# Patient Record
Sex: Male | Born: 1970
Health system: Southern US, Community
[De-identification: ages and names within clinical notes are randomized; demographics above are authoritative.]

## PROBLEM LIST (undated history)

## (undated) HISTORY — PX: HERNIA REPAIR: SHX51

---

## 2001-02-13 ENCOUNTER — Emergency Department (HOSPITAL_COMMUNITY): Admission: EM | Admit: 2001-02-13 | Discharge: 2001-02-13 | Payer: Self-pay | Admitting: Emergency Medicine

## 2001-02-13 ENCOUNTER — Encounter: Payer: Self-pay | Admitting: General Surgery

## 2001-02-21 ENCOUNTER — Encounter: Payer: Self-pay | Admitting: Urology

## 2001-02-21 ENCOUNTER — Ambulatory Visit (HOSPITAL_COMMUNITY): Admission: RE | Admit: 2001-02-21 | Discharge: 2001-02-21 | Payer: Self-pay | Admitting: Urology

## 2011-11-14 ENCOUNTER — Ambulatory Visit (INDEPENDENT_AMBULATORY_CARE_PROVIDER_SITE_OTHER): Payer: BC Managed Care – PPO | Admitting: Family Medicine

## 2011-11-14 VITALS — BP 107/71 | HR 74 | Temp 98.0°F | Resp 16 | Ht 65.0 in | Wt 165.0 lb

## 2011-11-14 DIAGNOSIS — Z0000 Encounter for general adult medical examination without abnormal findings: Secondary | ICD-10-CM

## 2011-11-14 DIAGNOSIS — R002 Palpitations: Secondary | ICD-10-CM

## 2011-11-14 DIAGNOSIS — H9192 Unspecified hearing loss, left ear: Secondary | ICD-10-CM

## 2011-11-14 LAB — POCT URINALYSIS DIPSTICK
Bilirubin, UA: NEGATIVE
Glucose, UA: NEGATIVE
Ketones, UA: NEGATIVE
Leukocytes, UA: NEGATIVE
Nitrite, UA: NEGATIVE
Protein, UA: NEGATIVE
Spec Grav, UA: 1.015
Urobilinogen, UA: 0.2
pH, UA: 5.5

## 2011-11-14 LAB — POCT UA - MICROSCOPIC ONLY
Bacteria, U Microscopic: NEGATIVE
Casts, Ur, LPF, POC: NEGATIVE
Crystals, Ur, HPF, POC: NEGATIVE
Epithelial cells, urine per micros: NEGATIVE
Mucus, UA: NEGATIVE
Yeast, UA: NEGATIVE

## 2011-11-14 LAB — POCT CBC
Granulocyte percent: 69.6 %G (ref 37–80)
HCT, POC: 45.6 % (ref 43.5–53.7)
Hemoglobin: 14.9 g/dL (ref 14.1–18.1)
Lymph, poc: 1.9 (ref 0.6–3.4)
MCH, POC: 30.9 pg (ref 27–31.2)
MCHC: 32.7 g/dL (ref 31.8–35.4)
MCV: 94.6 fL (ref 80–97)
MID (cbc): 0.5 (ref 0–0.9)
MPV: 7.4 fL (ref 0–99.8)
POC Granulocyte: 5.5 (ref 2–6.9)
POC LYMPH PERCENT: 23.9 %L (ref 10–50)
POC MID %: 6.5 %M (ref 0–12)
Platelet Count, POC: 280 10*3/uL (ref 142–424)
RBC: 4.82 M/uL (ref 4.69–6.13)
RDW, POC: 12.9 %
WBC: 7.9 10*3/uL (ref 4.6–10.2)

## 2011-11-14 NOTE — Progress Notes (Signed)
This is a 42 year old [place] man who is married with 2 children. He does nails. He exercises 5-6 days a week with home gym and jogging. He has no new complaints, but he is concerned about his hearing loss that occurred at age 70 and has never returned. He's had bilateral herniorrhaphies in the past but no symptoms were relatively to them.  Occasionally he notes palpitations(absence of heartbeat is how he describes it)  : Objective: Alert, cooperative, quite friendly and in no distress  HEENT: Unremarkable anatomically but he has almost complete hearing loss in the left side.  Neck: Supple no adenopathy no thyromegaly  Chest: Clear to auscultation  Heart: Regular no murmur or gallop  Abdomen: Soft nontender with smooth the border, liver span 7 cm midclavicular line, no splenomegaly or tenderness or masses  Genitalia: Herniorrhaphy scars bilaterally, testicles normal, uncircumcised  Skin: Warm and dry, no rash o  neurological: 4 extremities. Cranial nerves III through XII intact with the exception of loss of hearing on the left. This  Assessment: No new problems of significance at this time.  Plan usual annual physical labs.

## 2011-11-15 LAB — TSH: TSH: 1.449 u[IU]/mL (ref 0.350–4.500)

## 2011-11-15 LAB — COMPREHENSIVE METABOLIC PANEL
ALT: 19 U/L (ref 0–53)
AST: 22 U/L (ref 0–37)
Albumin: 4.5 g/dL (ref 3.5–5.2)
Alkaline Phosphatase: 71 U/L (ref 39–117)
BUN: 19 mg/dL (ref 6–23)
CO2: 26 mEq/L (ref 19–32)
Calcium: 9.3 mg/dL (ref 8.4–10.5)
Chloride: 105 mEq/L (ref 96–112)
Creat: 1.18 mg/dL (ref 0.50–1.35)
Glucose, Bld: 118 mg/dL — ABNORMAL HIGH (ref 70–99)
Potassium: 3.9 mEq/L (ref 3.5–5.3)
Sodium: 138 mEq/L (ref 135–145)
Total Bilirubin: 0.6 mg/dL (ref 0.3–1.2)
Total Protein: 7.4 g/dL (ref 6.0–8.3)

## 2011-11-15 LAB — LIPID PANEL
Cholesterol: 229 mg/dL — ABNORMAL HIGH (ref 0–200)
HDL: 66 mg/dL (ref >39)
LDL Cholesterol: 136 mg/dL — ABNORMAL HIGH (ref 0–99)
Total CHOL/HDL Ratio: 3.5 Ratio
Triglycerides: 135 mg/dL (ref <150)
VLDL: 27 mg/dL (ref 0–40)

## 2011-11-16 LAB — SEDIMENTATION RATE: Sed Rate: 1 mm/hr (ref 0–16)

## 2011-11-17 ENCOUNTER — Encounter: Payer: Self-pay | Admitting: *Deleted

## 2012-11-20 ENCOUNTER — Telehealth: Payer: Self-pay | Admitting: Internal Medicine

## 2012-11-20 DIAGNOSIS — H109 Unspecified conjunctivitis: Secondary | ICD-10-CM

## 2012-11-20 NOTE — Telephone Encounter (Signed)
Pt has conjunctivitis right eye. Wears glasses. Left eye not affected. No recent URI. Describes film over eye with irritation. Rx Ofloxacin opthalmic drops 2 gtts o.u.  4 times daily  x 5-7 days called to [employer].

## 2013-10-28 ENCOUNTER — Ambulatory Visit (INDEPENDENT_AMBULATORY_CARE_PROVIDER_SITE_OTHER): Payer: BC Managed Care – PPO | Admitting: Family Medicine

## 2013-10-28 VITALS — BP 122/68 | HR 84 | Temp 99.0°F | Resp 17 | Ht 65.5 in | Wt 164.0 lb

## 2013-10-28 DIAGNOSIS — Z23 Encounter for immunization: Secondary | ICD-10-CM

## 2013-10-28 DIAGNOSIS — Z789 Other specified health status: Secondary | ICD-10-CM

## 2013-10-28 DIAGNOSIS — Z1329 Encounter for screening for other suspected endocrine disorder: Secondary | ICD-10-CM

## 2013-10-28 DIAGNOSIS — Z130 Encounter for screening for diseases of the blood and blood-forming organs and certain disorders involving the immune mechanism: Secondary | ICD-10-CM

## 2013-10-28 DIAGNOSIS — Z13228 Encounter for screening for other metabolic disorders: Secondary | ICD-10-CM

## 2013-10-28 NOTE — Patient Instructions (Signed)
Vim Gan Siu Vi A (Hepatitis A) Vim gan siu vi A l b?nh nhi?m siu vi ? gan. Khng c b?ng ch?ng cho th?y b?nh vim gan siu vi A di?n ti?n thnh b?nh l m?n tnh (ko di). Vim gan A khng ti?n tri?n thnh ung th? gan gi?ng nh? vim gan B v vim gan C d?ng mn tnh th??ng b?. NGUYN NHN Vim gan siu vi A l gy ra b?i siu vi trng ???c g?i l siu vi vim gan A (g?i t?t l HAV). Siu vi trng xm nh?p vo c? th? qua ???ng mi?ng do th?c ?n hay n??c nhi?m khu?n (khng s?ch hay khng tinh khi?t). Sau khi xm nh?p vo trong c? th?, ban ??u nhi?m siu vi x?y ra ? ru?t, lan truy?n ??n gan v gy b?nh t?i ?. TRI?U CH?NG H?u h?t cc tr??ng h?p vim gan siu vi A nh? v c t tri?u ch?ng. Trong nh?ng tr??ng h?p nh? b?nh nhn c th? khng nh?n th?y h? ?ang m?c b?nh. Nh?ng ng??i khc c th? c?m th?y m?t m?i, bu?n nn v nn m?a, m?t c?m gic ngon mi?ng, b? s?t c?p th?p, ho?c b? ?au d??i x??ng s??n trn pha bn ph?i c?a b?ng. Sau ?, cc tri?u ch?ng c th? bao g?m n??c ti?u s?m mu, ?i tiu c mu sng, vng m?t v da (b?nh vng da) ho?c ng?a da. Vim gan A ?i khi c th? tr? nn nghim tr?ng v m?t s? tri?u ch?ng c th? ko di hng tu?n.  CH?N ?ON Chuyn gia ch?m [place] y t? c th? lm xt nghi?m mu ?? xem b?n c b? b?nh ny khng.  ?I?U TR? Vim gan A th??ng t? lnh sau vi tu?n. Khng c ph??ng php ?i?u tr? c? th? cho b?nh ny sau khi vi rt ? gy ra nhi?m trng. ?i?u quan tr?ng ??i v?i ng??i b? nhi?m b?nh l ngh? ng?i th?t nhi?u v khng tr? l?i lm vi?c hay ?i h?c l?i cho ??n khi h?t s?t, tr? l?i c?m gic ngon mi?ng, da v m?t khng cn mu vng. ?i?u c?ng quan tr?ng l trnh u?ng r??u v dng b?t c? lo?i thu?c no khng ???c ch? ??nh ho?c ph duy?t b?i chuyn gia ch?m [place] y t? c?a b?n.  PHNG NG?A S? d?ng globulin mi?n d?ch ng??i sau khi ti?p xc s? gip ng?n ng?a ho?c gi?m b?t m?c ?? nghim tr?ng c?a b?nh. Thu?c ny ch? c hi?u qu? n?u ???c s? d?ng trong vng 2 tu?n sau khi ti?p xc.  V?cxin  vim gan A c s?n v c hi?u qu? cao trong vi?c ng?n ng?a b?nh vim gan A khi ???c s? d?ng c? tr??c v sau khi ti?p xc v?i b?nh ly nhi?m ny. T?i [place] K?, v?cxin ny ???c khuy?n co nh? l s? thay th? cho globulin mi?n d?ch ng??i trong vng 2 tu?n sau khi ti?p xc v?i vi rt c?ng nh? cho nh?ng ng??i sau ?y tr??c khi ti?p xc:   T?t c? tr? em b?t ??u t? 1 tu?i.  Tr? em ? ?? tu?i t? 2 ??n 18 ? nh?ng ti?u bang v c?ng ??ng c cc ch??ng trnh tim ch?ng hi?n c.  Du khch qu?c t? ??n t?t c? cc vng m b?nh vim gan A v?n cn l m?t v?n ??.  Nh?ng ng??i ?n ng c quan h? tnh d?c v?i nam gi?i.  Nh?ng ng??i tim chch ma ty.  Nh?ng ng??i b? b?nh gan mn tnh do b?t k? nguyn nhn no.  Nh?ng ng??i s? d?ng y?u t? ?ng mu c ??c.  Nh?ng ng??i lm vi?c v?i vi rt vim gan A trong mi tr??ng phng th nghi?m nghin c?u. H??NG D?N CH?M Alpha T?I NH Sau khi b? nhi?m HAV, s? c th?i k? ? b?nh t? hai ??n su tu?n l? tr??c khi kh?i pht b?nh. Sau th?i gian ny siu vi vim gan A s? b? th?i qua phn trong vng 10 ??n 14 ngy. V cc ph?n t? siu vi th?i qua phn, gi? v? sinh t?t s? gip ng?n ng?a ly lan b?nh.   Nn th??ng xuyn r?a tay sau khi ?i v? sinh v tr??c khi l?y th?c ?n hay n??c u?ng.  ?y l b?nh ly nhi?m. Tun theo cc h??ng d?n c?a Bc s? ?? trnh ly lan b?nh.  Nh?ng ng??i s?ng v?i b?n c?n ???c tim v?cxin vim gan A.  Khng s? d?ng b?t k? lo?i thu?c no, bao g?m c? thu?c khng c?n k ??n ph? bi?n, cho ??n khi b?n h?i chuyn gia ch?m [place] y t? xem s? d?ng thu?c ny c ???c khng. HY ??N KHM B?NH NGAY L?P T?C N?U:  Khng th? ?n hay u?ng.  Bu?n nn ho?c i m?a b?t ??u ho?c ti?p t?c.  L l?n.  Vng da vng m?t ngy cng n?ng h?n.  L bu?n ng? nhi?u hay khng th? th?c t?nh. HY CH?C CH?N R?NG B?N:  Hi?u r nh?ng h??ng d?n khi xu?t vi?n.  S? theo di tnh tr?ng b?nh c?a b?n.  S? ??n khm b?nh ngay l?p t?c nh? ? ???c h??ng d?n. Document Released: 07/31/2005 Document  Revised: 10/23/2011 [facility] Patient Information 2014 [place], [place]. Immunization Information for Foreign Travel Immunizations can protect you from certain diseases. Immunizations can also prevent the spread of certain infections. It is important to see your caregiver or a travel medicine specialist 4 6 weeks before you travel. This allows time for vaccines to take effect. It also provides enough time for you to get vaccines that must be given in a series over a period of days or weeks. Immunizations for travelers include:  Routine vaccines. These vaccines are standard for the people in a country.  Recommended vaccines. These vaccines are recommended before travel to some countries or regions.  Required vaccines. These vaccines are necessary before travel to specific countries or regions. If it is less than 4 weeks before you leave, you should still see your caregiver. You might still benefit from vaccines or medicines. WHAT ARE THE ROUTINE VACCINES? Routine vaccines can protect you from diseases that are common in many parts of the world. Most routine vaccines are given at specific ages during your life. However, routine vaccines also include the annual flu (influenza) vaccine. You should be up-to-date on your routine immunizations before you travel. Your caregiver will be able to review your vaccine history and determine whether you have had all the routine vaccines. You may be advised to get extra doses or booster vaccines even if you are up-to-date on the routine vaccines. WHAT ARE THE RECOMMENDED VACCINES? Know your travel schedule when you visit your caregiver. The vaccines recommended before foreign travel will depend on several factors, including:  The country or countries of travel.  Whether you will travel to rural areas.  The length of time you will be traveling.  The season of the year.  Your age.  Your health status.  Your previous immunizations. Vaccine recommendations  change over time. Your caregiver can tell you what vaccines are recommended before  your trip. The annual influenza vaccine sometimes differs for the [place] and [place] hemispheres. Unless the annual vaccines are the same in both hemispheres, people with certain chronic medical conditions who are traveling to the other hemisphere shortly before or during the influenza season should also get the other influenza vaccine. The other influenza vaccine should be obtained either before leaving the country or shortly after arrival at the travel site. WHAT ARE THE REQUIRED VACCINES? Vaccines may be required during a current outbreak of an infectious disease in a country or region. Your caregiver will be able to tell you about any current outbreaks and required vaccines. For example, proof of yellow fever immunization is currently required for most people before traveling to certain countries in [place] and [place]. This vaccine can only be obtained at approved centers. You should get the yellow fever vaccine at least 10 days before your trip. After 10 days, most people show immunity to yellow fever. If it has been longer than 10 years since you received the yellow fever vaccine, another dose is required. If proof of immunization is incomplete or inaccurate, you could be quarantined, denied entry, or given another dose of vaccine at the travel site. If you cannot receive the yellow fever vaccine because of medical reasons, you must have a written statement from your caregiver. The statement must contain a medical reason for the lack of immunization. In such a case, your caregiver should then give you advice on how to decrease your chance of getting yellow fever. That advice should include taking precautions to avoid mosquito bites and limiting outdoor time. Other than having a medical condition or being under the age of 52 months, no other reasons will be accepted for not getting the vaccine.  Proof of  meningococcal immunization is required by the [place] for any person older than 2 years who is taking part in the [place] or [place]. Visas for traveling to the hajj or [place] will not even be issued until there is proof of immunization. You should get this vaccine at least 10 days before your trip. After 10 days, most people show immunity. If it has been longer than 3 years since your last immunization, another dose is required. FOR MORE INFORMATION  Centers for Disease Control and Prevention (CDC): http://www.wolf.info/  World Health Organization [facility]): RoleLink.com.br Document Released: 07/19/2009 Document Revised: 07/17/2012 Document Reviewed: 06/28/2012 [facility] Patient Information 2014 [place], [place].

## 2013-10-28 NOTE — Progress Notes (Signed)
° °  Subjective:    Patient ID: Craig King, male    DOB: 1971-02-20, 43 y.o.   MRN: [id number] This chart was scribed for Robyn Haber, MD by Anastasia Pall, ED Scribe. This patient was seen in room 10 and the patient's care was started at 6:55 PM.  Chief Complaint  Patient presents with   Immunizations    hep c, TDAP   HPI Craig King is a 43 y.o. male Pt presents to the [facility] for Hep-A and TDAP immunizations. He denies h/o the same immunizations. 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 He reports he is leaving the country to go to [place] in a few weeks. He denies any other requests, any symptoms.   PCP - No primary provider on file.  Patient Active Problem List   Diagnosis Date Noted   Hearing loss in left ear 11/14/2011   Prior to Admission medications   Not on File   Review of Systems  Constitutional: Negative for fever and activity change.   BP 122/68   Pulse 84   Temp(Src) 99 F (37.2 C) (Oral)   Resp 17   Ht 5' 5.5" (1.664 m)   Wt 164 lb (74.39 kg)   BMI 26.87 kg/m2   SpO2 98%     Objective:   Physical Exam Nursing note and vitals reviewed. Constitutional: He is oriented to person, place, and time. He appears well-developed and well-nourished. No distress.  HENT:  Head: Normocephalic and atraumatic.  Eyes: EOM are normal.  Neck: Neck supple.  Cardiovascular: Normal rate.   Pulmonary/Chest: Effort normal. No respiratory distress.  Musculoskeletal: Normal range of motion.  Neurological: He is alert and oriented to person, place, and time.  Skin: Skin is warm and dry.  Psychiatric: He has a normal mood and affect. His behavior is normal.      Assessment & Plan:  Patient travels - Plan: Hepatitis A vaccine adult IM, Tdap vaccine greater than or equal to 7yo IM  Immunization due - Plan: Hepatitis A vaccine adult IM, Tdap vaccine greater than or equal to 7yo IM  Signed, Robyn Haber, MD

## 2014-04-29 ENCOUNTER — Ambulatory Visit (INDEPENDENT_AMBULATORY_CARE_PROVIDER_SITE_OTHER): Payer: BC Managed Care – PPO

## 2014-04-29 VITALS — HR 77 | Wt 165.0 lb

## 2014-04-29 DIAGNOSIS — Z23 Encounter for immunization: Secondary | ICD-10-CM

## 2014-04-29 DIAGNOSIS — Z2839 Other underimmunization status: Secondary | ICD-10-CM

## 2014-04-29 DIAGNOSIS — Z283 Underimmunization status: Secondary | ICD-10-CM

## 2014-04-29 NOTE — Progress Notes (Signed)
   Subjective:    Patient ID: Craig King, male    DOB: June 12, 1971, 43 y.o.   MRN: [id number]  HPI Patient is here for his 2nd Hep A shot.     Review of Systems     Objective:   Physical Exam        Assessment & Plan:

## 2017-05-28 ENCOUNTER — Ambulatory Visit (INDEPENDENT_AMBULATORY_CARE_PROVIDER_SITE_OTHER): Payer: BLUE CROSS/BLUE SHIELD | Admitting: Family Medicine

## 2017-05-28 ENCOUNTER — Encounter: Payer: Self-pay | Admitting: Family Medicine

## 2017-05-28 VITALS — BP 112/74 | HR 95 | Temp 98.1°F | Resp 18 | Ht 65.55 in | Wt 165.2 lb

## 2017-05-28 DIAGNOSIS — G8929 Other chronic pain: Secondary | ICD-10-CM

## 2017-05-28 DIAGNOSIS — M25512 Pain in left shoulder: Secondary | ICD-10-CM | POA: Diagnosis not present

## 2017-05-28 NOTE — Patient Instructions (Signed)
     IF you received an x-ray today, you will receive an invoice from [facility]. Please contact  Radiology at [phone] with questions or concerns regarding your invoice.   IF you received labwork today, you will receive an invoice from [facility]. Please contact [facility] at [phone] with questions or concerns regarding your invoice.   Our billing staff will not be able to assist you with questions regarding bills from these companies.  You will be contacted with the lab results as soon as they are available. The fastest way to get your results is to activate your My Chart account. Instructions are located on the last page of this paperwork. If you have not heard from us regarding the results in 2 weeks, please contact this office.     

## 2017-05-28 NOTE — Progress Notes (Signed)
   10/15/201811:31 AM  Matilde Haymaker [birth year], 46 y.o. male [id number]  Chief Complaint  Patient presents with  . Shoulder Pain    both are sore but left is worse and goes all the way to elbow x2weeks     HPI:   Patient is a 46 y.o. male who presents today for worsening left shoulder pain, intermittent, for over a year, denies any trauma or surgeries, pain mostly when trying to reach arm overhead such as when trying to take off his shirt. Pain sometime radiates down his deltoid, no numbness/tingling or weakness. Thought it might be related to tennis elbow, though his elbow does not hurt, but those types of exercises have not helped.   Depression screen PHQ 2/9 05/28/2017  Decreased Interest 0  Down, Depressed, Hopeless 0  PHQ - 2 Score 0    No Known Allergies  Prior to Admission medications   Medication Sig Start Date End Date Taking? Authorizing Provider  Omega-3 Fatty Acids (FISH OIL) 1000 MG CAPS Take by mouth.   Yes [provider]    History reviewed. No pertinent past medical history.  Past Surgical History:  Procedure Laterality Date  . HERNIA REPAIR      Social History  Substance Use Topics  . Smoking status: Former Smoker    Packs/day: 0.50    Years: 10.00    Types: Cigarettes  . Smokeless tobacco: Never Used  . Alcohol use No    Family History  Problem Relation Age of Onset  . Hyperlipidemia Mother     ROS Per hpi  OBJECTIVE:  Blood pressure 112/74, pulse 95, temperature 98.1 F (36.7 C), temperature source Oral, resp. rate 18, height 5' 5.55" (1.665 m), weight 165 lb 3.2 oz (74.9 kg), SpO2 97 %.  Physical Exam Gen: AAOx3, NAD MSK: Right shoulder unremarkable. LEFT shoulder no swelling, erythema or TTP. FROM, mild pain with drop arch and lift, negative hawkins, positive neers. Elbow with mild TTP over medial epicondyle, otherwise unremarkable Strength, sensation and DTRs normal.    ASSESSMENT and PLAN  1. Chronic left shoulder pain Exam  suggestive of rotator cuff pathology. Discussed conservative measures. Provided patient educational handout with exercises. RTC precautions given.   Return if symptoms worsen or fail to improve.    Rutherford Guys, MD Primary Care at [place], [place] [address] Ph.  [phone] Fax [phone]

## 2017-07-31 ENCOUNTER — Ambulatory Visit (INDEPENDENT_AMBULATORY_CARE_PROVIDER_SITE_OTHER): Payer: BLUE CROSS/BLUE SHIELD

## 2017-07-31 ENCOUNTER — Encounter: Payer: Self-pay | Admitting: Family Medicine

## 2017-07-31 ENCOUNTER — Ambulatory Visit: Payer: BLUE CROSS/BLUE SHIELD | Admitting: Family Medicine

## 2017-07-31 ENCOUNTER — Other Ambulatory Visit: Payer: Self-pay

## 2017-07-31 VITALS — BP 118/64 | HR 92 | Temp 98.6°F | Resp 16 | Ht 65.55 in | Wt 166.6 lb

## 2017-07-31 DIAGNOSIS — M25512 Pain in left shoulder: Secondary | ICD-10-CM

## 2017-07-31 DIAGNOSIS — M19012 Primary osteoarthritis, left shoulder: Secondary | ICD-10-CM | POA: Diagnosis not present

## 2017-07-31 DIAGNOSIS — G8929 Other chronic pain: Secondary | ICD-10-CM

## 2017-07-31 NOTE — Progress Notes (Signed)
   12/18/20182:13 PM  M[place]on 12/05/1970, 46 y.o. male [id number]  Chief Complaint  Patient presents with  . Follow-up    left shoulder pain not any better     HPI:   Patient is a 46 y.o. male who presents today for worsening left shoulder pain, intermittent, for over a year, denies any trauma or surgeries, pain mostly when trying to reach arm overhead such as when trying to take off his shirt. Pain sometime radiates down his deltoid, no numbness/tingling or weakness. Thought it might be related to tennis elbow, though his elbow does not hurt, but those types of exercises have not helped. Last seen in Oct 2018, given exercises for rotator cuff strengthening, he has been doing these without any benefit. Now also hurts to sleep on it.   Depression screen PHQ 2/9 05/28/2017  Decreased Interest 0  Down, Depressed, Hopeless 0  PHQ - 2 Score 0    No Known Allergies  Prior to Admission medications   Medication Sig Start Date End Date Taking? Authorizing Provider  Omega-3 Fatty Acids (FISH OIL) 1000 MG CAPS Take by mouth.   Yes [provider]    History reviewed. No pertinent past medical history.  Past Surgical History:  Procedure Laterality Date  . HERNIA REPAIR      Social History   Tobacco Use  . Smoking status: Former Smoker    Packs/day: 0.50    Years: 10.00    Pack years: 5.00    Types: Cigarettes  . Smokeless tobacco: Never Used  Substance Use Topics  . Alcohol use: No    Family History  Problem Relation Age of Onset  . Hyperlipidemia Mother     ROS Per hpi  OBJECTIVE:  Blood pressure 118/64, pulse 92, temperature 98.6 F (37 C), resp. rate 16, height 5' 5.55" (1.665 m), weight 166 lb 9.6 oz (75.6 kg), SpO2 98 %.  Physical Exam  Gen: AAOx3, NAD MSK: Right shoulder unremarkable. LEFT shoulder no swelling, erythema, mild TTP over AC joint. FROM, mild pain with drop arch and lift, negative hawkins and neers, positive empty can. Strength, sensation  and DTRs normal.   Dg Shoulder Left  Result Date: 07/31/2017 CLINICAL DATA:  Left shoulder pain.  No injury. EXAM: LEFT SHOULDER - 2+ VIEW COMPARISON:  No recent . FINDINGS: No acute bony or joint abnormality otherwise noted. No evidence of fracture or dislocation. Minimal acromioclavicular degenerative change present. IMPRESSION: . No acute bony abnormality otherwise noted. Minimal acromioclavicular degenerative change present. Electronically Signed   By: Marcello Moores  Register   On: 07/31/2017 14:41     ASSESSMENT and PLAN 1. Chronic left shoulder pain - DG Shoulder Left; Future - Ambulatory referral to Orthopedic Surgery  Return if symptoms worsen or fail to improve.    Rutherford Guys, MD Primary Care at [place], [place] [address] Ph.  [phone] Fax [phone]

## 2017-07-31 NOTE — Patient Instructions (Signed)
     IF you received an x-ray today, you will receive an invoice from [facility]. Please contact [facility] at [phone] with questions or concerns regarding your invoice.   IF you received labwork today, you will receive an invoice from [facility]. Please contact [facility] at [phone] with questions or concerns regarding your invoice.   Our billing staff will not be able to assist you with questions regarding bills from these companies.  You will be contacted with the lab results as soon as they are available. The fastest way to get your results is to activate your My Chart account. Instructions are located on the last page of this paperwork. If you have not heard from us regarding the results in 2 weeks, please contact this office.     

## 2017-08-28 ENCOUNTER — Ambulatory Visit (INDEPENDENT_AMBULATORY_CARE_PROVIDER_SITE_OTHER): Payer: BLUE CROSS/BLUE SHIELD | Admitting: Orthopaedic Surgery

## 2017-08-28 ENCOUNTER — Encounter (INDEPENDENT_AMBULATORY_CARE_PROVIDER_SITE_OTHER): Payer: Self-pay | Admitting: Orthopaedic Surgery

## 2017-08-28 VITALS — BP 108/75 | HR 81 | Ht 66.0 in | Wt 165.0 lb

## 2017-08-28 DIAGNOSIS — M7712 Lateral epicondylitis, left elbow: Secondary | ICD-10-CM | POA: Diagnosis not present

## 2017-08-28 NOTE — Progress Notes (Signed)
Office Visit Note   Patient: Craig King           Date of Birth: 1970/11/11           MRN: [id number] Visit Date: 08/28/2017              Requested by: Rutherford Guys, MD [address] [place], [place] [address] PCP: Patient, No Pcp Per   Assessment & Plan: Visit Diagnoses:  1. Lateral epicondylitis, left elbow     Plan: Tennis elbow splint applied he can use intermittently.  Pathophysiology of lateral epicondylitis discussed.  If he has persistent symptoms he can return we can consider injection.  Activity modification discussed and reviewed.  Follow-Up Instructions: Return if symptoms worsen or fail to improve.   Orders:  No orders of the defined types were placed in this encounter.  No orders of the defined types were placed in this encounter.     Procedures: No procedures performed   Clinical Data: No additional findings.   Subjective: Chief Complaint  Patient presents with  . Left Shoulder - Pain    HPI   47 year old male who has had intermittent symptoms in his left elbow.  He is left-hand dominant had to quit playing tennis due to it.  He switched to bowling had some increased pain.  He worked out recently stopped using weights for period of time and just doing cardio due to increased left elbow discomfort.  Working he does nails.  He has had some pain that tends to radiate up to his shoulder he denies any numbness or tingling in his fingers no associated neck pain.  He denies fever or chills.  Review of Systems 14 point review of systems negative only surgery is been previous hernia surgery 1998.  He is not on any medications no allergies non-smoker does not drink.   Objective: Vital Signs: BP 108/75   Pulse 81   Ht 5' 6"  (1.676 m)   Wt 165 lb (74.8 kg)   BMI 26.63 kg/m   Physical Exam  Constitutional: He is oriented to person, place, and time. He appears well-developed and well-nourished.  HENT:  Head: Normocephalic and atraumatic.  Eyes: EOM are  normal. Pupils are equal, round, and reactive to light.  Neck: No tracheal deviation present. No thyromegaly present.  Cardiovascular: Normal rate.  Pulmonary/Chest: Effort normal. He has no wheezes.  Abdominal: Soft. Bowel sounds are normal.  Neurological: He is alert and oriented to person, place, and time.  Skin: Skin is warm and dry. Capillary refill takes less than 2 seconds.  Psychiatric: He has a normal mood and affect. His behavior is normal. Judgment and thought content normal.    Ortho Exam patient get his arm over his head easily good cervical range of motion rapidly no brachial plexus tenderness minimal discomfort with impingement test left shoulder.  Good muscle development upper extremities from working out.  Lateral epicondyle pressure reproduces his pain slight pain with full extension of the elbow at the lateral epicondyle radial head is normal collateral ligaments are normal wrist extension flexion normal some pain at the lateral epicondyle with gripping.  Specialty Comments:  No specialty comments available.  Imaging: No results found.   PMFS History: Patient Active Problem List   Diagnosis Date Noted  . Hearing loss in left ear 11/14/2011   No past medical history on file.  Family History  Problem Relation Age of Onset  . Hyperlipidemia Mother     Past Surgical History:  Procedure Laterality Date  . HERNIA REPAIR     Social History   Occupational History  . Not on file  Tobacco Use  . Smoking status: Former Smoker    Packs/day: 0.50    Years: 10.00    Pack years: 5.00    Types: Cigarettes  . Smokeless tobacco: Never Used  Substance and Sexual Activity  . Alcohol use: No  . Drug use: No  . Sexual activity: Yes    Birth control/protection: Pill

## 2018-08-12 ENCOUNTER — Ambulatory Visit (INDEPENDENT_AMBULATORY_CARE_PROVIDER_SITE_OTHER): Payer: Self-pay

## 2018-08-12 ENCOUNTER — Ambulatory Visit (INDEPENDENT_AMBULATORY_CARE_PROVIDER_SITE_OTHER): Payer: BLUE CROSS/BLUE SHIELD | Admitting: Orthopaedic Surgery

## 2018-08-12 ENCOUNTER — Encounter (INDEPENDENT_AMBULATORY_CARE_PROVIDER_SITE_OTHER): Payer: Self-pay | Admitting: Orthopaedic Surgery

## 2018-08-12 VITALS — BP 123/76 | HR 94 | Ht 66.0 in | Wt 165.0 lb

## 2018-08-12 DIAGNOSIS — M25511 Pain in right shoulder: Secondary | ICD-10-CM | POA: Diagnosis not present

## 2018-08-12 DIAGNOSIS — M7521 Bicipital tendinitis, right shoulder: Secondary | ICD-10-CM

## 2018-08-12 MED ORDER — METHYLPREDNISOLONE ACETATE 40 MG/ML IJ SUSP
40.0000 mg | INTRAMUSCULAR | Status: AC | PRN
  Administered 2018-08-12: 40 mg via INTRA_ARTICULAR

## 2018-08-12 MED ORDER — BUPIVACAINE HCL 0.25 % IJ SOLN
0.6600 mL | INTRAMUSCULAR | Status: AC | PRN
  Administered 2018-08-12: .66 mL via INTRA_ARTICULAR

## 2018-08-12 MED ORDER — LIDOCAINE HCL 1 % IJ SOLN
0.5000 mL | INTRAMUSCULAR | Status: AC | PRN
  Administered 2018-08-12: .5 mL

## 2018-08-12 NOTE — Progress Notes (Signed)
Office Visit Note   Patient: Craig King           Date of Birth: Dec 29, 1970           MRN: [id number] Visit Date: 08/12/2018              Requested by: No referring provider defined for this encounter. PCP: Patient, No Pcp Per   Assessment & Plan: Visit Diagnoses:  1. Acute pain of right shoulder   2. Biceps tendinitis of right shoulder     Plan: Right peri-long of the biceps tenderness injection performed with good relief.  We discussed activity modification with his workout exercise regiment.  He will return if he has increased symptoms.  He does not get relief we can consider diagnostic imaging evaluating for pot biceps tendinopathy of the long head.  Follow-Up Instructions: No follow-ups on file.   Orders:  Orders Placed This Encounter  Procedures  . Large Joint Inj  . XR Shoulder Right   No orders of the defined types were placed in this encounter.     Procedures: Large Joint Inj: R glenohumeral on 08/12/2018 2:52 PM Indications: pain Details: 22 G 1.5 in needle  Arthrogram: No  Medications: 40 mg methylPREDNISolone acetate 40 MG/ML; 0.5 mL lidocaine 1 %; 0.66 mL bupivacaine 0.25 % Outcome: tolerated well, no immediate complications Procedure, treatment alternatives, risks and benefits explained, specific risks discussed. Consent was given by the patient. Immediately prior to procedure a time out was called to verify the correct patient, procedure, equipment, support staff and site/side marked as required. Patient was prepped and draped in the usual sterile fashion.       Clinical Data: No additional findings.   Subjective: Chief Complaint  Patient presents with  . Right Shoulder - Pain    HPI 47 year old male [place] with right shoulder pain anteriorly for 3 to 4 weeks.  Is not worked on 4 to 5 weeks he does not use free weights anymore.  He notices increased pain particularly with pulling he does better sometimes of his arms up over his head.  He is  tried some Advil without relief also use some heat.  Review of Systems patient has been young healthy has hearing loss in his left ear otherwise negative.  Negative cardiovascular GI GU.   Objective: Vital Signs: BP 123/76   Pulse 94   Ht 5' 6"  (1.676 m)   Wt 165 lb (74.8 kg)   BMI 26.63 kg/m   Physical Exam Constitutional:      Appearance: He is well-developed.  HENT:     Head: Normocephalic and atraumatic.  Eyes:     Pupils: Pupils are equal, round, and reactive to light.  Neck:     Thyroid: No thyromegaly.     Trachea: No tracheal deviation.  Cardiovascular:     Rate and Rhythm: Normal rate.  Pulmonary:     Effort: Pulmonary effort is normal.     Breath sounds: No wheezing.  Abdominal:     General: Bowel sounds are normal.     Palpations: Abdomen is soft.  Skin:    General: Skin is warm and dry.     Capillary Refill: Capillary refill takes less than 2 seconds.  Neurological:     Mental Status: He is alert and oriented to person, place, and time.  Psychiatric:        Behavior: Behavior normal.        Thought Content: Thought content normal.  Judgment: Judgment normal.     Ortho Exam positive long head of biceps tenderness on the right negative on the right negative drop arm test mild discomfort with Neer positive Hawkins test.  No brachial plexus tenderness negative Spurling upper extremity reflexes are 2+ and symmetrical.  No tenderness over the midportion of the biceps muscle or distal biceps.  Sensation hand is intact.  Specialty Comments:  No specialty comments available.  Imaging: Xr Shoulder Right  Result Date: 08/12/2018 Three-view x-rays right shoulder obtained and reviewed.  Normal bony anatomy acromioclavicular joints normal no soft tissue calcification.  Negative for acute bony changes. Impression: Normal right shoulder x-rays.    PMFS History: Patient Active Problem List   Diagnosis Date Noted  . Hearing loss in left ear 11/14/2011   No  past medical history on file.  Family History  Problem Relation Age of Onset  . Hyperlipidemia Mother     Past Surgical History:  Procedure Laterality Date  . HERNIA REPAIR     Social History   Occupational History  . Not on file  Tobacco Use  . Smoking status: Former Smoker    Packs/day: 0.50    Years: 10.00    Pack years: 5.00    Types: Cigarettes  . Smokeless tobacco: Never Used  Substance and Sexual Activity  . Alcohol use: No  . Drug use: No  . Sexual activity: Yes    Birth control/protection: Pill

## 2018-12-26 ENCOUNTER — Encounter: Payer: Self-pay | Admitting: Family Medicine

## 2018-12-26 ENCOUNTER — Other Ambulatory Visit: Payer: Self-pay

## 2018-12-26 ENCOUNTER — Ambulatory Visit: Payer: Self-pay | Admitting: General Practice

## 2018-12-26 ENCOUNTER — Ambulatory Visit (HOSPITAL_COMMUNITY)
Admission: RE | Admit: 2018-12-26 | Discharge: 2018-12-26 | Disposition: A | Discharge status: Home / Self Care | Payer: BLUE CROSS/BLUE SHIELD | Source: Ambulatory Visit | Attending: Family Medicine | Admitting: Family Medicine

## 2018-12-26 ENCOUNTER — Ambulatory Visit: Payer: BLUE CROSS/BLUE SHIELD | Admitting: Family Medicine

## 2018-12-26 ENCOUNTER — Telehealth: Payer: Self-pay | Admitting: Family Medicine

## 2018-12-26 VITALS — BP 100/70 | HR 78 | Temp 99.3°F | Ht 65.5 in | Wt 160.0 lb

## 2018-12-26 DIAGNOSIS — R319 Hematuria, unspecified: Secondary | ICD-10-CM | POA: Diagnosis not present

## 2018-12-26 DIAGNOSIS — R82998 Other abnormal findings in urine: Secondary | ICD-10-CM | POA: Diagnosis not present

## 2018-12-26 DIAGNOSIS — R109 Unspecified abdominal pain: Secondary | ICD-10-CM | POA: Diagnosis not present

## 2018-12-26 DIAGNOSIS — N132 Hydronephrosis with renal and ureteral calculous obstruction: Secondary | ICD-10-CM | POA: Diagnosis not present

## 2018-12-26 LAB — POCT URINALYSIS DIP (MANUAL ENTRY)
Bilirubin, UA: NEGATIVE
Glucose, UA: NEGATIVE mg/dL
Ketones, POC UA: NEGATIVE mg/dL
Leukocytes, UA: NEGATIVE
Nitrite, UA: NEGATIVE
Spec Grav, UA: 1.02 (ref 1.010–1.025)
Urobilinogen, UA: 0.2 E.U./dL
pH, UA: 6.5 (ref 5.0–8.0)

## 2018-12-26 NOTE — Telephone Encounter (Signed)
Pt called in c/o lower abd pain below his navel.  See triage notes.  I warm transferred the call to [facility] [address] [place] to be scheduled for a visit.   Reason for Disposition . [1] MILD-MODERATE pain AND [2] constant AND [3] present > 2 hours  Answer Assessment - Initial Assessment Questions 1. LOCATION: "Where does it hurt?"      Hurts below the belly button 2. RADIATION: "Does the pain shoot anywhere else?" (e.g., chest, back)     No 3. ONSET: "When did the pain begin?" (Minutes, hours or days ago)      Started over a week ago.  It went away.   The last couple of days it has come back. 4. SUDDEN: "Gradual or sudden onset?"     Gradually  5. PATTERN "Does the pain come and go, or is it constant?"    - If constant: "Is it getting better, staying the same, or worsening?"      (Note: Constant means the pain never goes away completely; most serious pain is constant and it progresses)     - If intermittent: "How long does it last?" "Do you have pain now?"     (Note: Intermittent means the pain goes away completely between bouts)     Intermittent but now for 3 days. 6. SEVERITY: "How bad is the pain?"  (e.g., Scale 1-10; mild, moderate, or severe)    - MILD (1-3): doesn't interfere with normal activities, abdomen soft and not tender to touch     - MODERATE (4-7): interferes with normal activities or awakens from sleep, tender to touch     - SEVERE (8-10): excruciating pain, doubled over, unable to do any normal activities       7 when it's bad 7. RECURRENT SYMPTOM: "Have you ever had this type of abdominal pain before?" If so, ask: "When was the last time?" and "What happened that time?"      No.   I have a history of kidney stones 2001.   8. CAUSE: "What do you think is causing the abdominal pain?"     In morning I have a BM daily.   Had BM this morning but not like normal.  My urine is a different color it's darker.  No burning.  Not going more often as far as urinating.   I do drink  a lot of water.    9. RELIEVING/AGGRAVATING FACTORS: "What makes it better or worse?" (e.g., movement, antacids, bowel movement)     Yesterday I took [employer].   I think it helped some.    10. OTHER SYMPTOMS: "Has there been any vomiting, diarrhea, constipation, or urine problems?"       No diarrhea.   See above for urine problems.   No lower back pain.  This pain is different from kidney stone.  Protocols used: ABDOMINAL PAIN - MALE-A-AH

## 2018-12-26 NOTE — Progress Notes (Signed)
Acute Office Visit  Subjective:    Patient ID: Craig King, male    DOB: [birth year], 48 y.o.   MRN: [id number]  Chief Complaint  Patient presents with  . Abdominal Pain    started a week ago and came 3 days ago . right now pain more on left side  . dark urine    HPI Patient is in today for  Abdominal pain and urinary changes. Pt with no back pain, left lower quad abdominal pain. Pt with no fever. Pt with dark urine , no blood. + h/o kidney stone. No nausea. No vomiting. No fever. PMH kidney stone  Past Surgical History:  Procedure Laterality Date  . HERNIA REPAIR      Family History  Problem Relation Age of Onset  . Hyperlipidemia Mother     Social History   Socioeconomic History  . Marital status: Single    Spouse name: Not on file  . Number of children: Not on file  . Years of education: Not on file  . Highest education level: Not on file  Occupational History  . Not on file  Social Needs  . Financial resource strain: Not on file  . Food insecurity:    Worry: Not on file    Inability: Not on file  . Transportation needs:    Medical: Not on file    Non-medical: Not on file  Tobacco Use  . Smoking status: Former Smoker    Packs/day: 0.50    Years: 10.00    Pack years: 5.00    Types: Cigarettes  . Smokeless tobacco: Never Used  Substance and Sexual Activity  . Alcohol use: No  . Drug use: No  . Sexual activity: Yes    Birth control/protection: Pill  Lifestyle  . Physical activity:    Days per week: Not on file    Minutes per session: Not on file  . Stress: Not on file  Relationships  . Social connections:    Talks on phone: Not on file    Gets together: Not on file    Attends religious service: Not on file    Active member of club or organization: Not on file    Attends meetings of clubs or organizations: Not on file    Relationship status: Not on file  . Intimate partner violence:    Fear of current or ex partner: Not on file    Emotionally abused:  Not on file    Physically abused: Not on file    Forced sexual activity: Not on file  Other Topics Concern  . Not on file  Social History Narrative  . Not on file    Outpatient Medications Prior to Visit  Medication Sig Dispense Refill  . Omega-3 Fatty Acids (FISH OIL) 1000 MG CAPS Take by mouth.     No facility-administered medications prior to visit.     No Known Allergies  ROS    CONSTITUTIONAL: no  feverCV: no chest pain, irregular heart beat, swelling of the legs or feet, pain in the legs with walking RESP: no SOB or  Cough, GI: abdominal pain--left lower quad, no blood in stool, no bowel changes GU: NO pain with urination, NO  urinary frequency, NO  urgency,    Objective:    Physical Exam  Constitutional: He appears well-developed and well-nourished.  Cardiovascular: Normal rate and regular rhythm.  Pulmonary/Chest: Effort normal and breath sounds normal.  Abdominal: He exhibits no distension. There is abdominal tenderness. There is  no rebound and no guarding.  left lower quad-no CVA tenderness  BP 100/70 (BP Location: Right Arm, Patient Position: Sitting, Cuff Size: Normal)   Pulse 78   Temp 99.3 F (37.4 C) (Oral)   Ht 5' 5.5" (1.664 m)   Wt 160 lb (72.6 kg)   SpO2 99%   BMI 26.22 kg/m  Wt Readings from Last 3 Encounters:  12/26/18 160 lb (72.6 kg)  08/12/18 165 lb (74.8 kg)  08/28/17 165 lb (74.8 kg)    Health Maintenance Due  Topic Date Due  . HIV Screening  12/21/1985      Lab Results  Component Value Date   TSH 1.449 11/14/2011   Lab Results  Component Value Date   WBC 7.9 11/14/2011   HGB 14.9 11/14/2011   HCT 45.6 11/14/2011   MCV 94.6 11/14/2011   Lab Results  Component Value Date   NA 138 11/14/2011   K 3.9 11/14/2011   CO2 26 11/14/2011   GLUCOSE 118 (H) 11/14/2011   BUN 19 11/14/2011   CREATININE 1.18 11/14/2011   BILITOT 0.6 11/14/2011   ALKPHOS 71 11/14/2011   AST 22 11/14/2011   ALT 19 11/14/2011   PROT 7.4 11/14/2011    ALBUMIN 4.5 11/14/2011   CALCIUM 9.3 11/14/2011   Lab Results  Component Value Date   CHOL 229 (H) 11/14/2011   Lab Results  Component Value Date   HDL 66 11/14/2011   Lab Results  Component Value Date   LDLCALC 136 (H) 11/14/2011   Lab Results  Component Value Date   TRIG 135 11/14/2011   Lab Results  Component Value Date   CHOLHDL 3.5 11/14/2011   No results found for: HGBA1C     Assessment & Plan:   Abdominal pain, unspecified abdominal location - Plan: POCT urinalysis dipstick, CT RENAL STONE STUDY, Ambulatory referral to Urology  Dark urine - Plan: Urine Culture, CT RENAL STONE STUDY, Ambulatory referral to Urology  Hematuria, unspecified type - Plan: CT RENAL STONE STUDY, Ambulatory referral to Urology   D/w pt CT + kidney stone-obstructing-increase water-urology referral ASAP  Pierrette Scheu Hannah Beat, MD

## 2018-12-26 NOTE — Telephone Encounter (Signed)
Please schedule this patient for urology referral asap-he has a kidney stone and was showing blood in his urine.  Thank you

## 2018-12-26 NOTE — Patient Instructions (Signed)
° ° ° °  If you have lab work done today you will be contacted with your lab results within the next 2 weeks.  If you have not heard from us then please contact us. The fastest way to get your results is to register for My Chart. ° ° °IF you received an x-ray today, you will receive an invoice from [facility]. Please contact [facility] at [phone] with questions or concerns regarding your invoice.  ° °IF you received labwork today, you will receive an invoice from [facility]. Please contact [facility] at [phone] with questions or concerns regarding your invoice.  ° °Our billing staff will not be able to assist you with questions regarding bills from these companies. ° °You will be contacted with the lab results as soon as they are available. The fastest way to get your results is to activate your My Chart account. Instructions are located on the last page of this paperwork. If you have not heard from us regarding the results in 2 weeks, please contact this office. °  ° ° ° °

## 2018-12-27 LAB — URINE CULTURE: Organism ID, Bacteria: NO GROWTH

## 2019-01-16 DIAGNOSIS — R3121 Asymptomatic microscopic hematuria: Secondary | ICD-10-CM | POA: Diagnosis not present

## 2019-01-16 DIAGNOSIS — N201 Calculus of ureter: Secondary | ICD-10-CM | POA: Diagnosis not present

## 2019-01-16 DIAGNOSIS — R8271 Bacteriuria: Secondary | ICD-10-CM | POA: Diagnosis not present

## 2019-06-15 IMAGING — CT CT RENAL STONE PROTOCOL
2 of 3 series · 16 of 46 positions shown, 18 images · non-contrast
Comparison: None.

CLINICAL DATA: Abdominal pain and left flank pain with gross
hematuria over the last week. History of stone disease.

EXAM:
CT ABDOMEN AND PELVIS WITHOUT CONTRAST
TECHNIQUE: Multidetector CT imaging of the abdomen and pelvis was performed
following the standard protocol without IV contrast.

[Series 3: lung bases · axial · 0.69mm/px · z∈[-133,-9]mm · 13 of 72 slices shown, 15 images]
[im 5/72  soft-tissue]
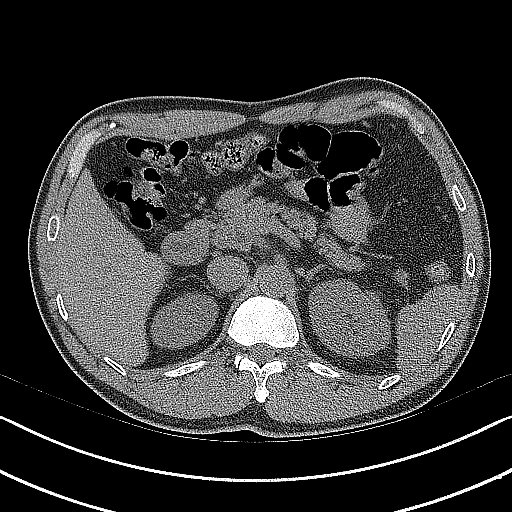
[im 5/72  bone]
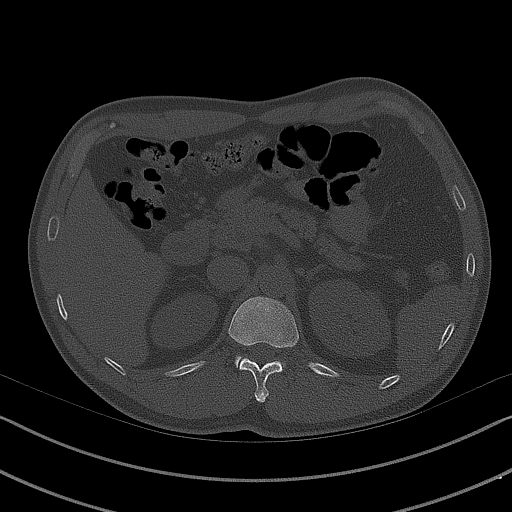
[im 10/72  soft-tissue]
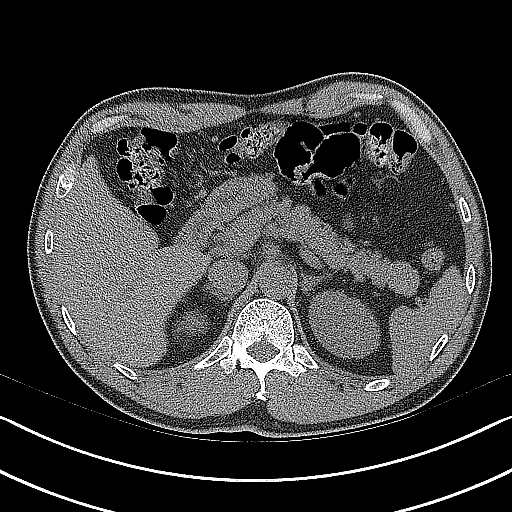
[im 14/72  soft-tissue]
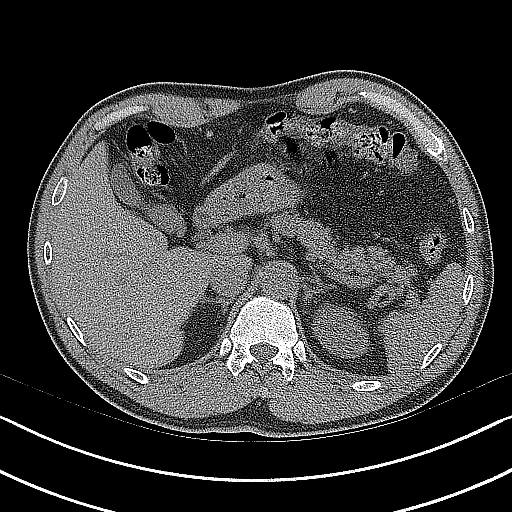
[im 21/72  soft-tissue]
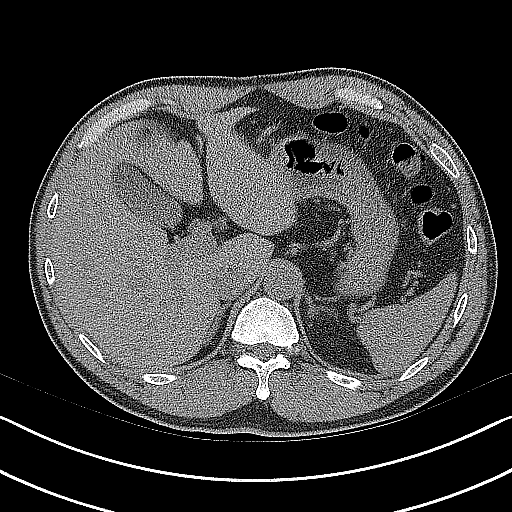
[im 26/72  soft-tissue]
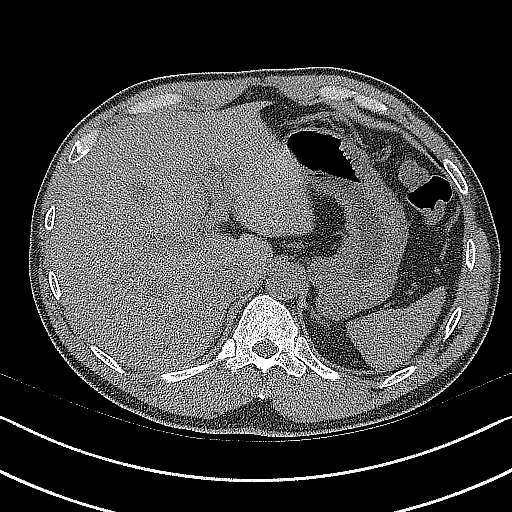
[im 30/72  soft-tissue]
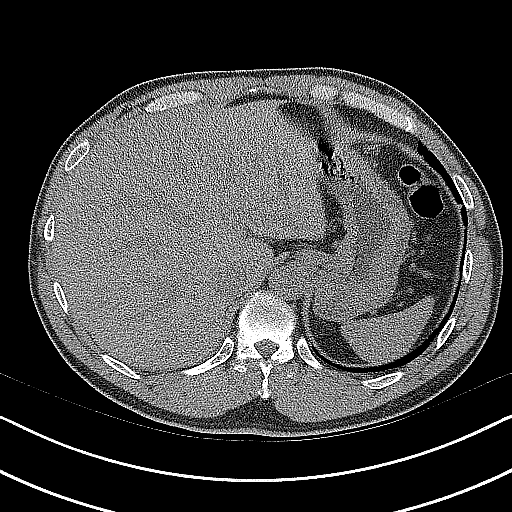
[im 37/72  soft-tissue]
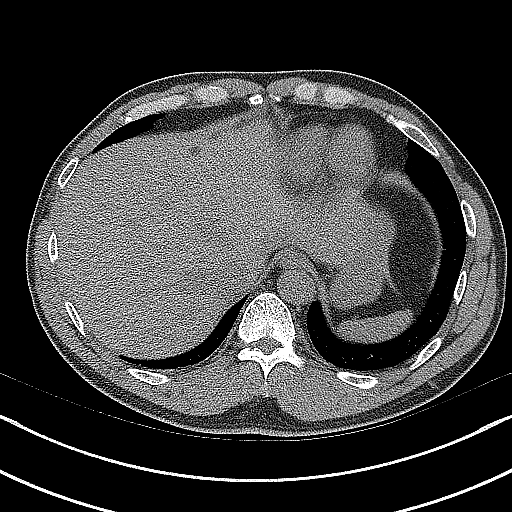
[im 42/72  soft-tissue]
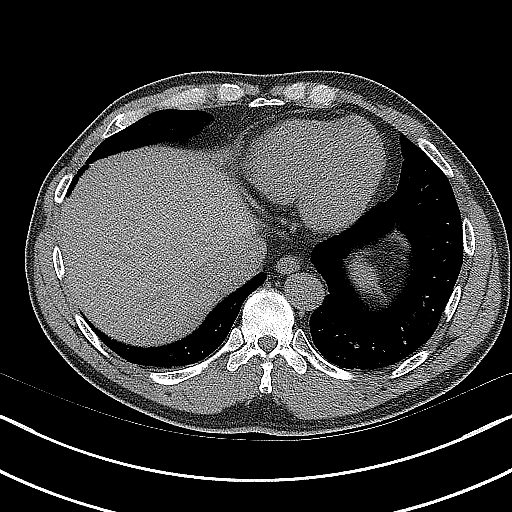
[im 46/72  soft-tissue]
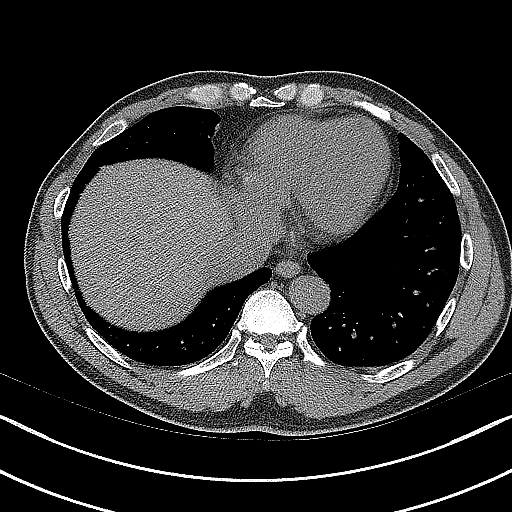
[im 46/72  bone]
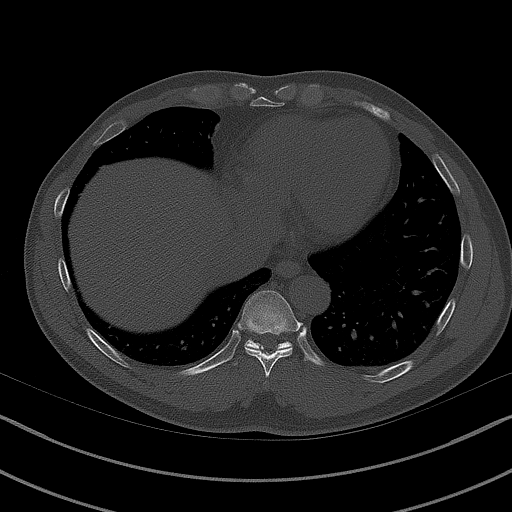
[im 51/72  soft-tissue]
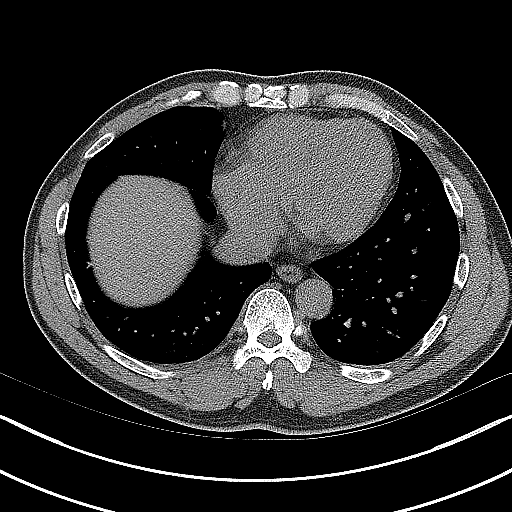
[im 58/72  soft-tissue]
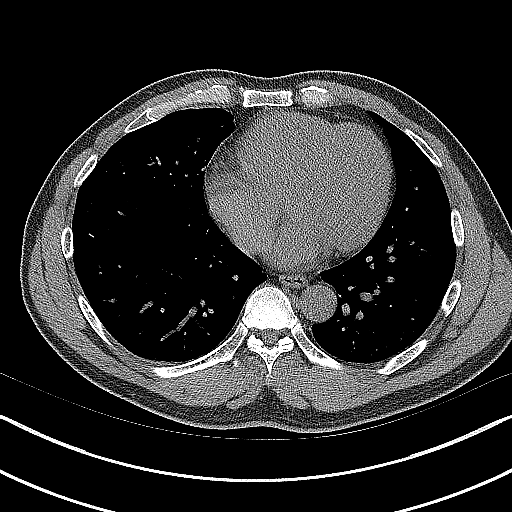
[im 62/72  soft-tissue]
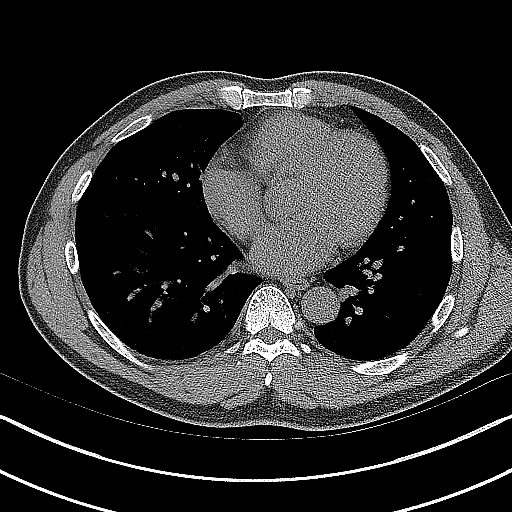
[im 67/72  soft-tissue]
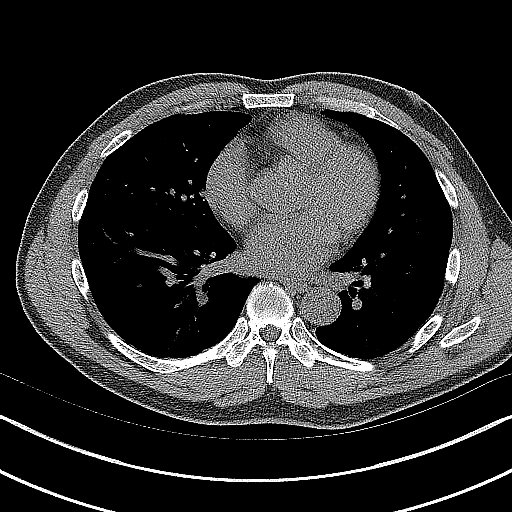

[Series 5: coronal · coronal · 0.73mm/px · 3 of 161 slices shown]
[im 54/161  soft-tissue]
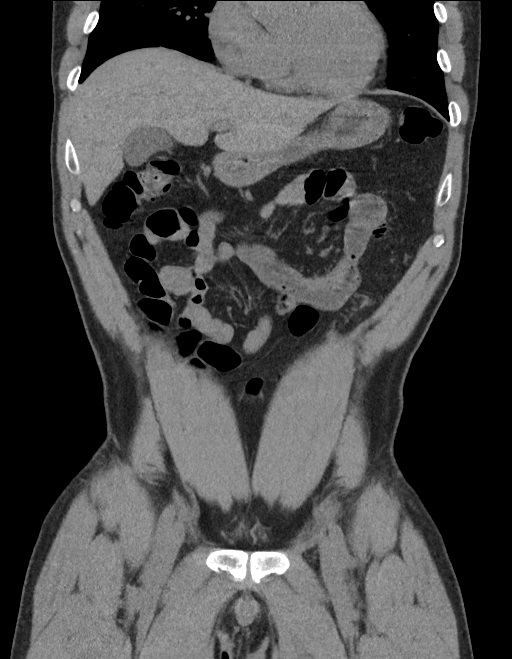
[im 72/161  soft-tissue]
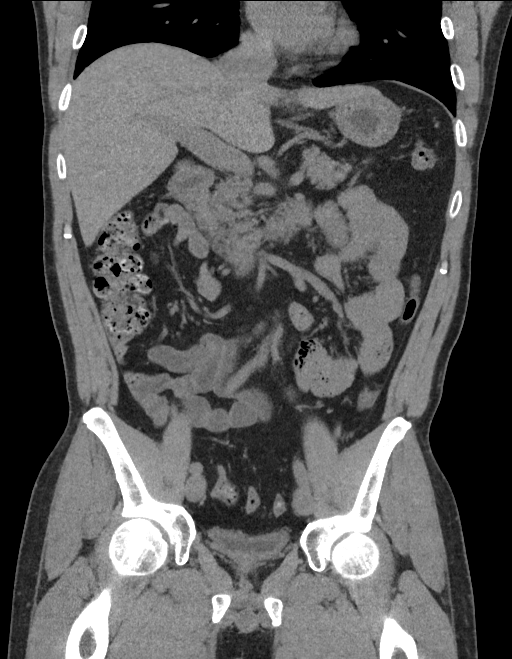
[im 89/161  soft-tissue]
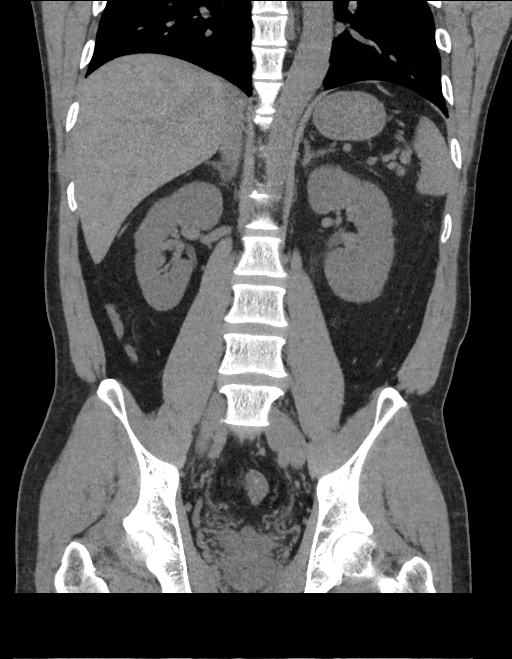

[16 of 46 positions shown; findings below may reference images not displayed]

FINDINGS: Lower chest: Normal

Hepatobiliary: Few small liver cysts.  No calcified gallstones.

Pancreas: Normal

Spleen: Normal

Adrenals/Urinary Tract: Adrenal glands are normal. The right kidney
is normal. No cyst, mass, stone or hydronephrosis. The left kidney
contains a 2 x 3 mm stone in the upper pole and a 2 x 3 mm stone in
the lower pole. Kidney does not appear swollen. Very mild prominence
of the left ureter, to the level of the pelvic brim where there is
an impacted stone showing a length of 9.6 mm with a transverse
diameter of 5.4 mm. No stone in the bladder.

Stomach/Bowel: Normal

Vascular/Lymphatic: Normal

Reproductive: Normal

Other: No free fluid or air.

Musculoskeletal: Normal
IMPRESSION: 9.6 mm in length by 5.4 mm transverse stone impacted in the left
ureter at the pelvic brim level. There is only mild left
hydronephrosis however.

Nonobstructing 2 x 3 mm stones in the upper pole and lower pole of
the left kidney.

## 2020-03-16 DIAGNOSIS — Z23 Encounter for immunization: Secondary | ICD-10-CM | POA: Diagnosis not present

## 2020-03-16 DIAGNOSIS — Z125 Encounter for screening for malignant neoplasm of prostate: Secondary | ICD-10-CM | POA: Diagnosis not present

## 2020-03-16 DIAGNOSIS — N529 Male erectile dysfunction, unspecified: Secondary | ICD-10-CM | POA: Diagnosis not present

## 2020-03-16 DIAGNOSIS — R946 Abnormal results of thyroid function studies: Secondary | ICD-10-CM | POA: Diagnosis not present

## 2020-03-16 DIAGNOSIS — Z0000 Encounter for general adult medical examination without abnormal findings: Secondary | ICD-10-CM | POA: Diagnosis not present

## 2020-03-16 DIAGNOSIS — R7309 Other abnormal glucose: Secondary | ICD-10-CM | POA: Diagnosis not present

## 2020-03-22 DIAGNOSIS — Z1211 Encounter for screening for malignant neoplasm of colon: Secondary | ICD-10-CM | POA: Diagnosis not present

## 2020-03-22 DIAGNOSIS — Z1212 Encounter for screening for malignant neoplasm of rectum: Secondary | ICD-10-CM | POA: Diagnosis not present
